# Patient Record
Sex: Male | Born: 1965 | Marital: Single | Smoking: Never smoker
Health system: Southern US, Community
[De-identification: ages and names within clinical notes are randomized; demographics above are authoritative.]

---

## 2012-01-31 ENCOUNTER — Emergency Department (HOSPITAL_COMMUNITY): Payer: PRIVATE HEALTH INSURANCE

## 2012-01-31 ENCOUNTER — Encounter (HOSPITAL_COMMUNITY): Payer: Self-pay | Admitting: *Deleted

## 2012-01-31 ENCOUNTER — Emergency Department (HOSPITAL_COMMUNITY)
Admission: EM | Admit: 2012-01-31 | Discharge: 2012-01-31 | Disposition: A | Discharge status: Home / Self Care | Payer: PRIVATE HEALTH INSURANCE | Attending: Emergency Medicine | Admitting: Emergency Medicine

## 2012-01-31 DIAGNOSIS — J189 Pneumonia, unspecified organism: Secondary | ICD-10-CM | POA: Insufficient documentation

## 2012-01-31 DIAGNOSIS — R079 Chest pain, unspecified: Secondary | ICD-10-CM | POA: Insufficient documentation

## 2012-01-31 DIAGNOSIS — J45901 Unspecified asthma with (acute) exacerbation: Secondary | ICD-10-CM | POA: Insufficient documentation

## 2012-01-31 DIAGNOSIS — K7689 Other specified diseases of liver: Secondary | ICD-10-CM | POA: Insufficient documentation

## 2012-01-31 DIAGNOSIS — R0601 Orthopnea: Secondary | ICD-10-CM | POA: Insufficient documentation

## 2012-01-31 DIAGNOSIS — K760 Fatty (change of) liver, not elsewhere classified: Secondary | ICD-10-CM

## 2012-01-31 DIAGNOSIS — R0602 Shortness of breath: Secondary | ICD-10-CM | POA: Insufficient documentation

## 2012-01-31 LAB — CBC
HCT: 42.5 % (ref 39.0–52.0)
Hemoglobin: 14.9 g/dL (ref 13.0–17.0)
MCH: 31.2 pg (ref 26.0–34.0)
MCV: 88.9 fL (ref 78.0–100.0)
Platelets: 178 10*3/uL (ref 150–400)
RBC: 4.78 MIL/uL (ref 4.22–5.81)
WBC: 4.4 10*3/uL (ref 4.0–10.5)

## 2012-01-31 LAB — D-DIMER, QUANTITATIVE: D-Dimer, Quant: 2.31 ug/mL-FEU — ABNORMAL HIGH (ref 0.00–0.48)

## 2012-01-31 LAB — POCT I-STAT TROPONIN I: Troponin i, poc: 0 ng/mL (ref 0.00–0.08)

## 2012-01-31 LAB — BASIC METABOLIC PANEL
BUN: 22 mg/dL (ref 6–23)
CO2: 29 mEq/L (ref 19–32)
Calcium: 9.3 mg/dL (ref 8.4–10.5)
Chloride: 99 mEq/L (ref 96–112)
Creatinine, Ser: 1.07 mg/dL (ref 0.50–1.35)
Glucose, Bld: 127 mg/dL — ABNORMAL HIGH (ref 70–99)

## 2012-01-31 LAB — PRO B NATRIURETIC PEPTIDE: Pro B Natriuretic peptide (BNP): 11.8 pg/mL (ref 0–125)

## 2012-01-31 MED ORDER — IBUPROFEN 200 MG PO TABS
600.0000 mg | ORAL_TABLET | Freq: Once | ORAL | Status: AC
  Administered 2012-01-31: 600 mg via ORAL
  Filled 2012-01-31: qty 3

## 2012-01-31 MED ORDER — AZITHROMYCIN 250 MG PO TABS: 250.0000 mg | ORAL_TABLET | Freq: Every day | ORAL | Status: AC

## 2012-01-31 MED ORDER — IPRATROPIUM BROMIDE 0.02 % IN SOLN
0.5000 mg | Freq: Once | RESPIRATORY_TRACT | Status: AC
  Administered 2012-01-31: 0.5 mg via RESPIRATORY_TRACT
  Filled 2012-01-31: qty 2.5

## 2012-01-31 MED ORDER — ALBUTEROL SULFATE (5 MG/ML) 0.5% IN NEBU
5.0000 mg | INHALATION_SOLUTION | Freq: Once | RESPIRATORY_TRACT | Status: AC
  Administered 2012-01-31: 5 mg via RESPIRATORY_TRACT
  Filled 2012-01-31: qty 0.5

## 2012-01-31 MED ORDER — PREDNISONE 20 MG PO TABS: 60.0000 mg | ORAL_TABLET | Freq: Every day | ORAL | Status: AC

## 2012-01-31 MED ORDER — ALBUTEROL SULFATE (5 MG/ML) 0.5% IN NEBU
5.0000 mg | INHALATION_SOLUTION | Freq: Once | RESPIRATORY_TRACT | Status: AC
  Administered 2012-01-31: 5 mg via RESPIRATORY_TRACT
  Filled 2012-01-31: qty 1

## 2012-01-31 MED ORDER — ALBUTEROL SULFATE HFA 108 (90 BASE) MCG/ACT IN AERS: 1.0000 | INHALATION_SPRAY | Freq: Four times a day (QID) | RESPIRATORY_TRACT | Status: AC | PRN

## 2012-01-31 MED ORDER — PREDNISONE 20 MG PO TABS
60.0000 mg | ORAL_TABLET | Freq: Once | ORAL | Status: AC
  Administered 2012-01-31: 60 mg via ORAL
  Filled 2012-01-31: qty 3

## 2012-01-31 MED ORDER — ASPIRIN 325 MG PO TABS
325.0000 mg | ORAL_TABLET | ORAL | Status: AC
  Administered 2012-01-31: 325 mg via ORAL
  Filled 2012-01-31: qty 1

## 2012-01-31 MED ORDER — IOHEXOL 300 MG/ML  SOLN
100.0000 mL | Freq: Once | INTRAMUSCULAR | Status: AC | PRN
  Administered 2012-01-31: 100 mL via INTRAVENOUS

## 2012-01-31 NOTE — ED Notes (Signed)
Patient walked to the end of the hall and back his oxygen level was 98% and his pulse was 80 but he got short winded.

## 2012-01-31 NOTE — ED Notes (Signed)
Patient walked to the end of the hall and back oxygen level was 96% pulse was 80.  Patient complain of being short winded.

## 2012-01-31 NOTE — ED Notes (Signed)
PT c/o L chest pain starting at noon yesterday. Pt denies injury. Pt states increased pain w/ inspiration and lying on side or back.

## 2012-01-31 NOTE — Discharge Instructions (Signed)
It is important for you to follow up with a Primary Care Physician.  See resource guide below.  Take prescriptions as prescribed.  You d-dimer was elevated today.  You will need to follow up with a Primary Care Physician to have this rechecked.  Return to the Emergency Department if symptoms change or worsen, your chest pain returns, you become increasingly short of breath, have difficulty breathing, or any other symptoms that are concerning to you.

## 2012-01-31 NOTE — ED Notes (Signed)
Patient transported to X-ray 

## 2012-01-31 NOTE — ED Provider Notes (Signed)
History     CSN: [id number]  Arrival date & time 01/31/12  4098   First MD Initiated Contact with Patient 01/31/12 [phone]      Chief Complaint  Patient presents with  . Chest Pain    (Consider location/radiation/quality/duration/timing/severity/associated sxs/prior treatment) HPI Comments: Patient with PMH significant for asthma comes in today with a chief complaint of shortness of breath and left sided chest pain.  He describes the pain as a sharp tightness and states that it radiates through the back.  He reports that the pain began yesterday afternoon and is unchanged from onset.  Pain and shortness of breath worse when lying flat on his back.  He denies any cough, fever/chills, wheezing, diaphoresis, or nausea.  He has not taken anything for his symptoms.  He reports that he does not have an inhaler at home.  He denies any prior cardiac history.  He reports that he does not have a Cardiologist and has never had a cardiac stress test.  No history of HTN or hyperlipidemia.    Patient does not have any recent travel in the past 4 weeks, no surgeries in the past 4 weeks, no prior history of DVT/PE, no swelling of LE.  Patient currently does not smoke and reports that he never has.  Patient is a 46 y.o. male presenting with shortness of breath. The history is provided by the patient.  Shortness of Breath  Associated symptoms include chest pain, orthopnea and shortness of breath. Pertinent negatives include no fever, no rhinorrhea, no cough and no wheezing. He has had no prior hospitalizations. He has had no prior ICU admissions. He has had no prior intubations. His past medical history is significant for asthma.    History reviewed. No pertinent past medical history.  History reviewed. No pertinent past surgical history.  History reviewed. No pertinent family history.  History  Substance Use Topics  . Smoking status: Never Smoker   . Smokeless tobacco: Not on file  . Alcohol Use: No        Review of Systems  Constitutional: Negative for fever, chills and diaphoresis.  HENT: Negative for rhinorrhea.   Respiratory: Positive for chest tightness and shortness of breath. Negative for cough and wheezing.   Cardiovascular: Positive for chest pain and orthopnea. Negative for leg swelling.  Gastrointestinal: Negative for nausea and vomiting.  Skin: Negative for rash.  Neurological: Negative for dizziness, syncope and light-headedness.    Allergies  Review of patient's allergies indicates no known allergies.  Home Medications  No current outpatient prescriptions on file.  BP 133/83  Pulse 73  Temp(Src) 97.8 F (36.6 C) (Oral)  Resp 18  SpO2 100%  Physical Exam  Nursing note and vitals reviewed. Constitutional: He appears well-developed and well-nourished. No distress.  HENT:  Head: Normocephalic and atraumatic.  Mouth/Throat: Oropharynx is clear and moist.  Neck: Normal range of motion. Neck supple.  Cardiovascular: Normal rate, regular rhythm, normal heart sounds and intact distal pulses.   No murmur heard. Pulmonary/Chest: Effort normal. No accessory muscle usage. Not tachypneic. No respiratory distress. He has decreased breath sounds. He has wheezes. He has no rhonchi. He has no rales.  Neurological: He is alert.  Skin: Skin is warm and dry. No rash noted. He is not diaphoretic.  Psychiatric: He has a normal mood and affect.    ED Course  Procedures (including critical care time)  Labs Reviewed  BASIC METABOLIC PANEL - Abnormal; Notable for the following:    Glucose, Bld  127 (*)    GFR calc non Af Amer 83 (*)    All other components within normal limits  CBC   Dg Chest 2 View  01/31/2012  *RADIOLOGY REPORT*  Clinical Data: Shortness of breath and pleurisy.  CHEST - 2 VIEW  Comparison: None.  Findings: Shallow inspiration.  There is infiltration or atelectasis in both lung bases, greatest on the left.  No blunting of costophrenic angles.  No  pneumothorax.  Mild cardiac enlargement with normal pulmonary vascularity.  IMPRESSION: Infiltration or atelectasis in both lung bases, greater on the left.  Original Report Authenticated By: Marlon Pel, M.D.     No diagnosis found.  Discussed with Dr. Radford Pax.  10:15 AM  Reassessed patient after breathing treatment.  Patient reports that his shortness of breath has not improved.  Mildly decreased breath sounds in all lung fields.  Will order another breathing treatment.  11:15 AM Reassessed patient after 2nd breathing treatment.  Patient reports that his shortness of breath has not improved.  Lungs CTAB.   Patient ambulated in ED and pulse ox remained above 97.  However, shortness of breath worsened with ambulation.    Discussed with Dr. Radford Pax once again.  Patient does not feel that his shortness of breath has improved after two breathing treatments and Prednisone.  Will order 3 hour troponin and d-dimer.    12:30 PM Patient has elevated d-dimer.  Will order CTA to rule out PE. 3:14 PM Reassessed patient.  He reports that he is feeling much better at this time and that his shortness of breath has significantly improved.  Denies chest pain.  Results of the CTA still pending. 3:32 PM Discussed with Dr. Manson Passey with Radiology.  She reports that the computer system is down and she has not been able to see the image.  IT is currently working on the issue.  Therefore, results of the CTA are still pending at this time.  Patient has been notified of the delay. 3:59 PM Discussed results of the CTA and the plan with Dr. Radford Pax.  He recommends discharging the patient home with Rx for Prednisone, Albuterol inhaler, and Azithromycin.  He recommends having the patient follow up with PCP regarding the elevated d-dimer.     Date: 01/31/2012  Rate: 69  Rhythm: normal sinus rhythm  QRS Axis: normal  Intervals: normal  ST/T Wave abnormalities: normal  Conduction Disutrbances:none  Narrative  Interpretation:   Old EKG Reviewed: none available     MDM  Patient with PMH significant for asthma comes in today with SOB and left sided chest tightness.  Patient given two breathing treatments and 60 mg Prednisone while in the ED and eventually reported significant improvement in symptoms.  Patient with negative initial and 3 hour troponin.  Normal EKG.  CXR and CTA demonstrates possible pneumonia.  Will treat patient with Azithromycin for CAP.  Patient also had elevated d-dimer.  CTA negative for large pulmonary embolism.  Patient instructed to follow up with PCP regarding elevated d-dimer.  Patient not hypoxic, not tachycardic, and does not have any risk factors for PE.  Return precautions discussed with patient.  Patient verbalizes understanding and is in agreement with plan.        Pascal Lux [place], PA-C 01/31/12 2204

## 2012-02-01 NOTE — ED Provider Notes (Signed)
Medical screening examination/treatment/procedure(s) were performed by non-physician practitioner and as supervising physician I was immediately available for consultation/collaboration.    Nelia Shi, MD 02/01/12 1134

## 2014-08-21 ENCOUNTER — Emergency Department (HOSPITAL_COMMUNITY)
Admission: EM | Admit: 2014-08-21 | Discharge: 2014-08-21 | Discharge status: Against Medical Advice | Payer: PRIVATE HEALTH INSURANCE

## 2019-08-08 ENCOUNTER — Other Ambulatory Visit: Payer: Self-pay | Admitting: Orthopedic Surgery

## 2019-08-08 DIAGNOSIS — M25561 Pain in right knee: Secondary | ICD-10-CM

## 2019-08-08 DIAGNOSIS — R609 Edema, unspecified: Secondary | ICD-10-CM

## 2019-08-16 ENCOUNTER — Other Ambulatory Visit: Payer: Self-pay

## 2019-08-16 ENCOUNTER — Ambulatory Visit
Admission: RE | Admit: 2019-08-16 | Discharge: 2019-08-16 | Disposition: A | Discharge status: Home / Self Care | Payer: PRIVATE HEALTH INSURANCE | Source: Ambulatory Visit | Attending: Orthopedic Surgery | Admitting: Orthopedic Surgery

## 2019-08-16 DIAGNOSIS — M25561 Pain in right knee: Secondary | ICD-10-CM

## 2019-08-16 DIAGNOSIS — R609 Edema, unspecified: Secondary | ICD-10-CM

## 2020-08-02 IMAGING — MR MR KNEE*R* W/O CM
4 of 7 series · 21 of 40 positions shown · non-contrast
Comparison: None.

CLINICAL DATA: Pain and swelling for 2 weeks no known injury

EXAM:
MRI OF THE RIGHT KNEE WITHOUT CONTRAST
TECHNIQUE: Multiplanar, multisequence MR imaging of the knee was performed. No
intravenous contrast was administered.

[Series 3: T2 fat-sat · axial · 4.0mm · 0.31mm/px · z∈[-62,+61]mm · 5 of 29 slices shown]
[im 1/29]
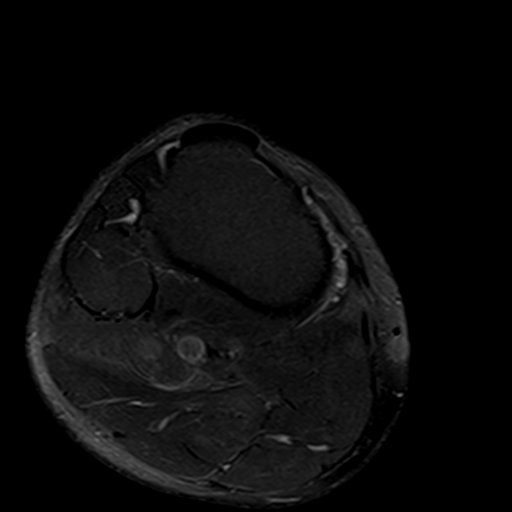
[im 6/29]
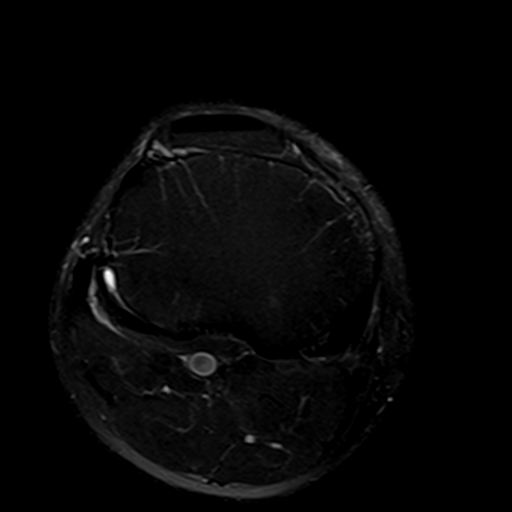
[im 12/29]
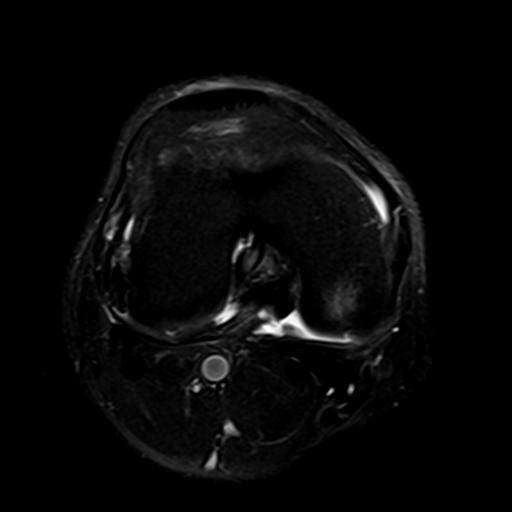
[im 17/29]
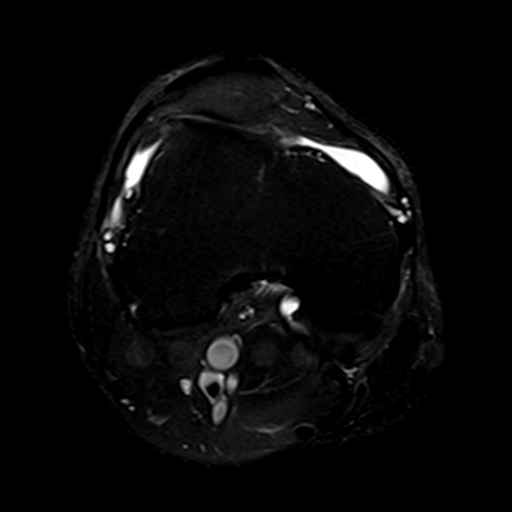
[im 29/29]
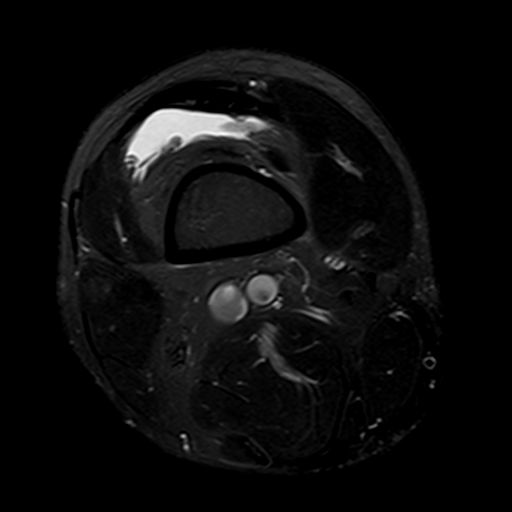

[Series 6: PD fat-sat · coronal · 4.0mm · 0.39mm/px · 6 of 24 slices shown (1 of 3)]
[im 1/24]
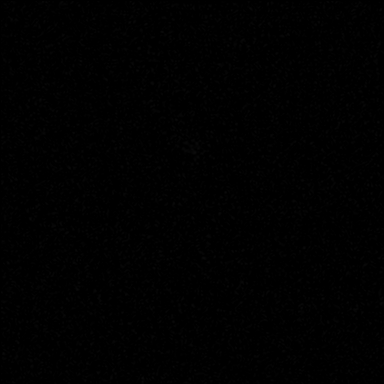
[im 5/24]
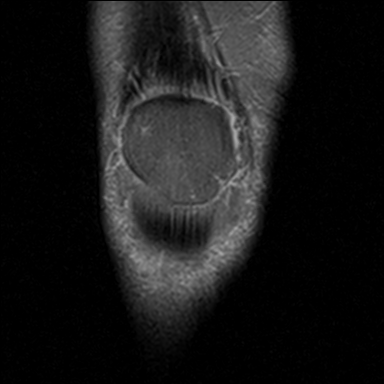
[im 10/24]
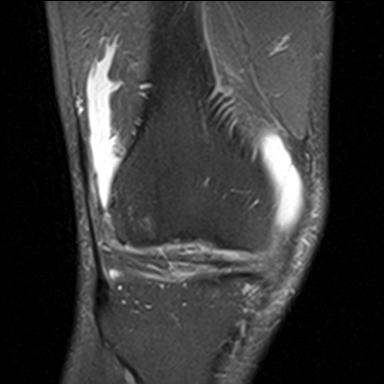
[im 14/24]
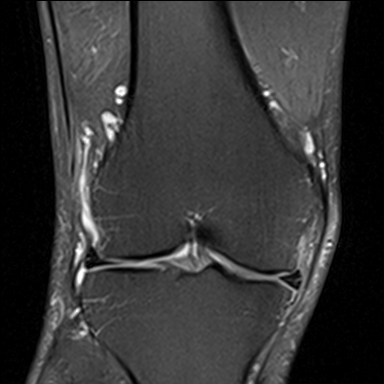
[im 19/24]
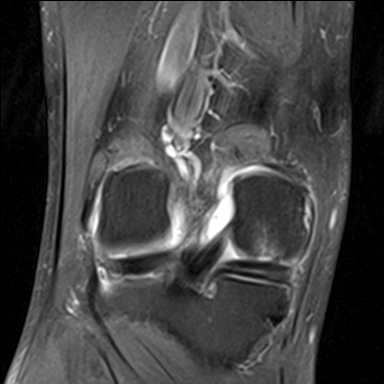
[im 24/24]
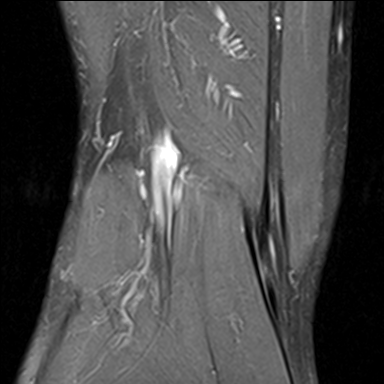

[Series 7: PD fat-sat · sagittal · 3.0mm · 0.29mm/px · 7 of 30 slices shown (2 of 3)]
[im 1/30]
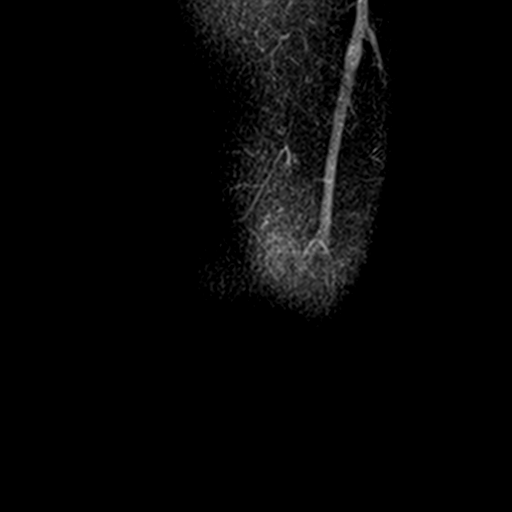
[im 5/30]
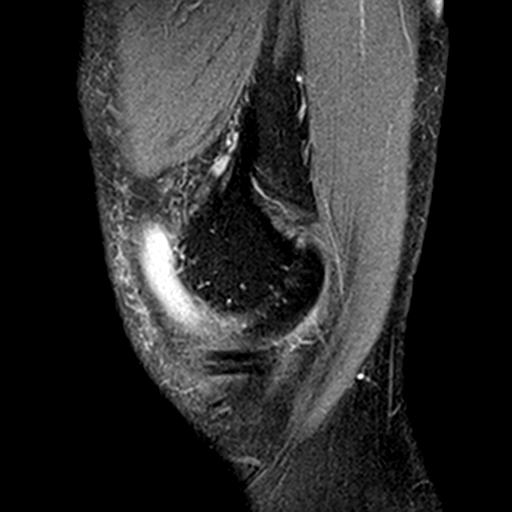
[im 10/30]
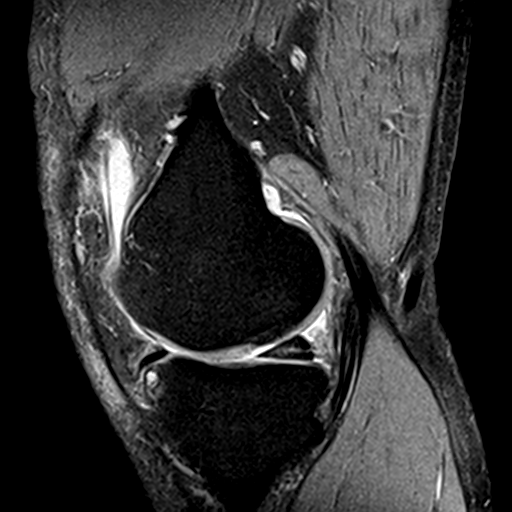
[im 15/30]
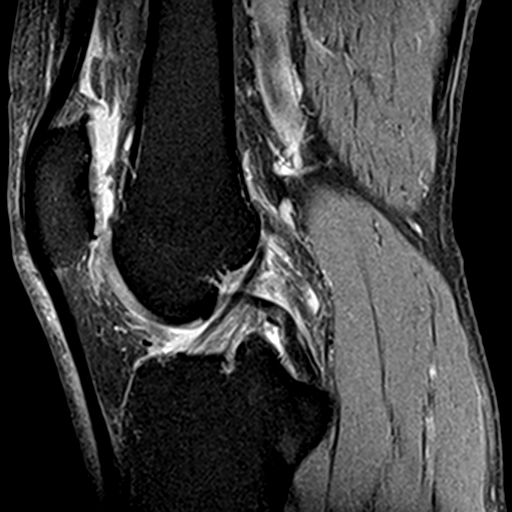
[im 20/30]
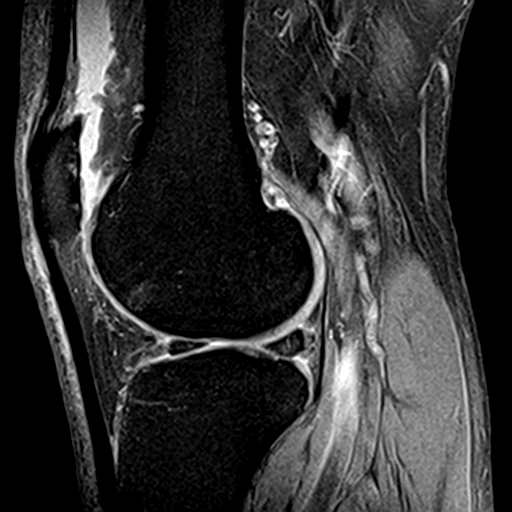
[im 25/30]
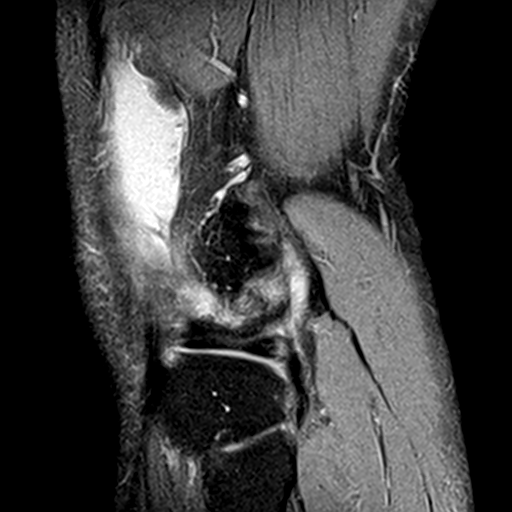
[im 30/30]
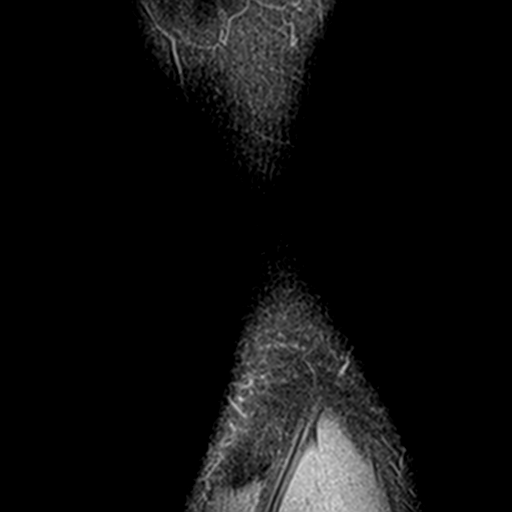

[Series 9: PD fat-sat · oblique · 2.0mm · 0.29mm/px · 3 of 11 slices shown (3 of 3)]
[im 1/11]
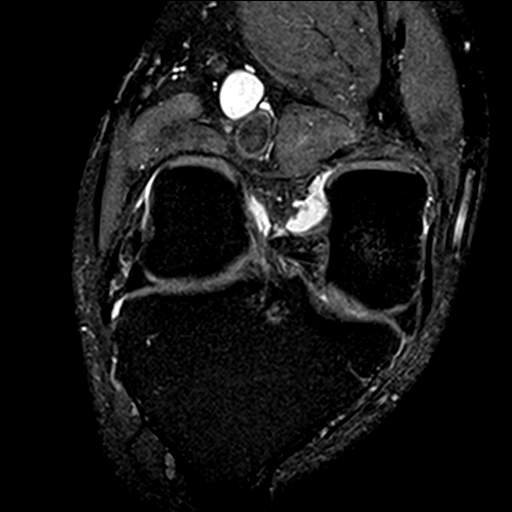
[im 6/11]
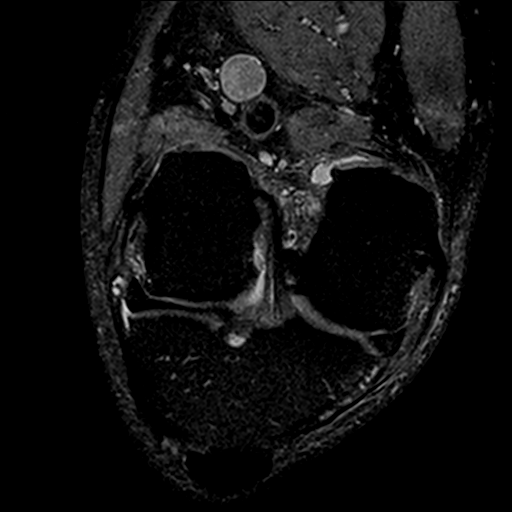
[im 11/11]
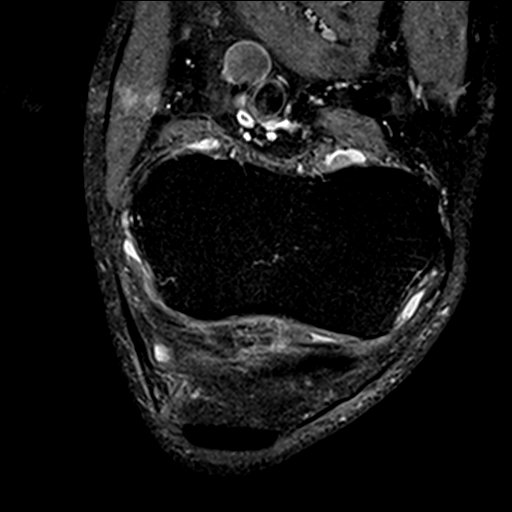

[21 of 40 positions shown; findings below may reference images not displayed]

FINDINGS: MENISCI

Medial: There is a nondisplaced horizontal longitudinal tear seen of
the posterior medial meniscus which extends to the mid body. The
root attachment however is still intact.

Lateral: There is increased intrameniscal signal seen in the
posterior horn of the lateral meniscus, however does not contact the
underlying articular surface. Posterior to the lateral meniscus
there is a T1/T2 dark lesion measuring 6 mm, likely a small loose
body.

LIGAMENTS

Cruciates: The ACL is intact. The PCL is intact.

Collaterals: Mild thickening of the medial collateral ligament,
however it is intact. The lateral collateral ligamentous complex is
intact.

CARTILAGE

Patellofemoral: There is slight lateral subluxation of the patella.
There is deep chondral thinning/loss seen at the lateral patellar
facet and lateral trochlear surface with underlying subchondral
cystic changes in the patellar surface. Small marginal osteophytes
are seen.

Medial compartment: There is a focal chondral defect seen in the
posterior weight-bearing surface of the medial femoral condyle
measuring approximately 5 mm in length with underlying reactive
marrow.

Lateral compartment: Mild chondral fissuring seen the weight-bearing
surface of the lateral femoral condyle lateral tibial plateau.

BONES: No fracture. No avascular necrosis. No pathologic marrow
infiltration.

JOINT: There is a moderate knee joint effusion with scattered
debris. There is edema seen within the superolateral corner of
Hoffa's fat pad. No plical thickening.

EXTENSOR MECHANISM: The patellar and quadriceps tendon are intact.
The retinaculum is unremarkable.

POPLITEAL FOSSA: No popliteal cyst.

OTHER: The visualized muscles are normal in appearance. Mild
prepatellar subcutaneous edema is seen.
IMPRESSION: 1. Nondisplaced tear of the posterior medial meniscus extending to
the mid body.
2. Intrasubstance degeneration of the posterior lateral meniscus.
3. 6 mm loose body seen adjacent to the posterior horn of the
lateral meniscus.
4. Intact cruciate ligaments
5. Tricompartmental osteoarthritis, most notable in the
patellofemoral compartment with grade 3-4 chondromalacia.
6. 6 mm focal chondral defect seen within the weight-bearing surface
of the medial femoral condyle with underlying reactive marrow.
7. Findings suggestive patellofemoral maltracking
8. Moderate knee joint effusion

## 2023-05-01 ENCOUNTER — Encounter (HOSPITAL_BASED_OUTPATIENT_CLINIC_OR_DEPARTMENT_OTHER): Payer: Self-pay

## 2023-05-01 ENCOUNTER — Emergency Department (HOSPITAL_BASED_OUTPATIENT_CLINIC_OR_DEPARTMENT_OTHER)
Admission: EM | Admit: 2023-05-01 | Discharge: 2023-05-01 | Disposition: A | Discharge status: Home / Self Care | Payer: BC Managed Care – PPO | Attending: Emergency Medicine | Admitting: Emergency Medicine

## 2023-05-01 ENCOUNTER — Other Ambulatory Visit: Payer: Self-pay

## 2023-05-01 ENCOUNTER — Emergency Department (HOSPITAL_BASED_OUTPATIENT_CLINIC_OR_DEPARTMENT_OTHER): Payer: BC Managed Care – PPO

## 2023-05-01 DIAGNOSIS — R109 Unspecified abdominal pain: Secondary | ICD-10-CM

## 2023-05-01 DIAGNOSIS — N201 Calculus of ureter: Secondary | ICD-10-CM | POA: Insufficient documentation

## 2023-05-01 LAB — COMPREHENSIVE METABOLIC PANEL
ALT: 68 U/L — ABNORMAL HIGH (ref 0–44)
AST: 47 U/L — ABNORMAL HIGH (ref 15–41)
Albumin: 4 g/dL (ref 3.5–5.0)
Alkaline Phosphatase: 52 U/L (ref 38–126)
Anion gap: 11 (ref 5–15)
BUN: 27 mg/dL — ABNORMAL HIGH (ref 6–20)
CO2: 18 mmol/L — ABNORMAL LOW (ref 22–32)
Calcium: 8.9 mg/dL (ref 8.9–10.3)
Chloride: 103 mmol/L (ref 98–111)
Creatinine, Ser: 1.92 mg/dL — ABNORMAL HIGH (ref 0.61–1.24)
GFR, Estimated: 41 mL/min — ABNORMAL LOW (ref >60)
Glucose, Bld: 153 mg/dL — ABNORMAL HIGH (ref 70–99)
Potassium: 3.7 mmol/L (ref 3.5–5.1)
Sodium: 132 mmol/L — ABNORMAL LOW (ref 135–145)
Total Bilirubin: 1.3 mg/dL — ABNORMAL HIGH (ref 0.3–1.2)
Total Protein: 8.4 g/dL — ABNORMAL HIGH (ref 6.5–8.1)

## 2023-05-01 LAB — URINALYSIS, ROUTINE W REFLEX MICROSCOPIC
Glucose, UA: NEGATIVE mg/dL
Ketones, ur: NEGATIVE mg/dL
Leukocytes,Ua: NEGATIVE
Nitrite: NEGATIVE
Protein, ur: 100 mg/dL — AB
Specific Gravity, Urine: >=1.03 (ref 1.005–1.030)
pH: 5.5 (ref 5.0–8.0)

## 2023-05-01 LAB — URINALYSIS, MICROSCOPIC (REFLEX)

## 2023-05-01 LAB — CBC WITH DIFFERENTIAL/PLATELET
Abs Immature Granulocytes: 0.04 10*3/uL (ref 0.00–0.07)
Basophils Absolute: 0 10*3/uL (ref 0.0–0.1)
Basophils Relative: 0 %
Eosinophils Absolute: 0 10*3/uL (ref 0.0–0.5)
Eosinophils Relative: 0 %
HCT: 42.1 % (ref 39.0–52.0)
Hemoglobin: 15.2 g/dL (ref 13.0–17.0)
Immature Granulocytes: 1 %
Lymphocytes Relative: 7 %
Lymphs Abs: 0.6 10*3/uL — ABNORMAL LOW (ref 0.7–4.0)
MCH: 34.2 pg — ABNORMAL HIGH (ref 26.0–34.0)
MCHC: 36.1 g/dL — ABNORMAL HIGH (ref 30.0–36.0)
MCV: 94.6 fL (ref 80.0–100.0)
Monocytes Absolute: 0.8 10*3/uL (ref 0.1–1.0)
Monocytes Relative: 10 %
Neutro Abs: 6.5 10*3/uL (ref 1.7–7.7)
Neutrophils Relative %: 82 %
Platelets: 162 10*3/uL (ref 150–400)
RBC: 4.45 MIL/uL (ref 4.22–5.81)
RDW: 12.9 % (ref 11.5–15.5)
WBC: 8 10*3/uL (ref 4.0–10.5)
nRBC: 0 % (ref 0.0–0.2)

## 2023-05-01 LAB — LACTIC ACID, PLASMA: Lactic Acid, Venous: 1.2 mmol/L (ref 0.5–1.9)

## 2023-05-01 MED ORDER — ONDANSETRON 4 MG PO TBDP: 4.0000 mg | ORAL_TABLET | Freq: Three times a day (TID) | ORAL | 0 refills | Status: AC | PRN

## 2023-05-01 MED ORDER — OXYCODONE HCL 5 MG PO TABS: 5.0000 mg | ORAL_TABLET | Freq: Four times a day (QID) | ORAL | 0 refills | Status: AC | PRN

## 2023-05-01 MED ORDER — TAMSULOSIN HCL 0.4 MG PO CAPS: 0.4000 mg | ORAL_CAPSULE | Freq: Every day | ORAL | 0 refills | Status: AC

## 2023-05-01 MED ORDER — OXYCODONE-ACETAMINOPHEN 5-325 MG PO TABS: 1.0000 | ORAL_TABLET | Freq: Three times a day (TID) | ORAL | Status: DC | PRN

## 2023-05-01 MED ORDER — SODIUM CHLORIDE 0.9 % IV BOLUS
1000.0000 mL | Freq: Once | INTRAVENOUS | Status: AC
  Administered 2023-05-01: 1000 mL via INTRAVENOUS

## 2023-05-01 MED ORDER — ONDANSETRON HCL 4 MG/2ML IJ SOLN: 4.0000 mg | Freq: Three times a day (TID) | INTRAMUSCULAR | Status: DC | PRN

## 2023-05-01 NOTE — Discharge Instructions (Signed)
As discussed, your symptoms are secondary to two 2 mm stones.  They are right at the junction of your bladder about to pass.  Will send in medication called Zofran to take as needed for nausea/vomiting, medicine for pain as well as medicine called Flomax to help aid with passage of stone.  Call number attached your discharge papers to set up an appointment on Monday with urology.  If you develop fever, intractable pain/nausea/vomiting, symptoms concerning for urinary tract infection, return to emergency department.

## 2023-05-01 NOTE — ED Triage Notes (Signed)
Patient seen at urgent care for flank pain. He has blood in his urine and fever of 102 for them. He was given 60mg  Toradol and 1000mg  tylenol from them.

## 2023-05-01 NOTE — ED Provider Notes (Signed)
[facility] Provider Note   CSN: [id number] Arrival date & time: 05/01/23  0900     History  Chief Complaint  Patient presents with   Flank Pain    Dwayne Jackson is a 57 y.o. male.   Flank Pain   57 year old male presents emergency department with complaints of right-sided flank pain.  Patient states that he developed right-sided flank pain upon awakening this morning.  Denies history of similar symptoms.  States that he took his temperature when he woke up and it was around 31 F.  Went to an urgent care and found to be reportedly febrile with a temp of 102.  Was given 60 mg of Toradol as well as 1000 mg of Tylenol and states that fever and pain significantly improved.  Reports associated nausea when experiencing pain earlier.  Denies any vomiting, chest pain, shortness of breath, abdominal pain, urinary frequency/urinary urgency.  Does state that he is noticed blood in his urine today.  No significant pertinent past medical history.  Home Medications Prior to Admission medications   Medication Sig Start Date End Date Taking? Authorizing Provider  ondansetron (ZOFRAN-ODT) 4 MG disintegrating tablet Take 1 tablet (4 mg total) by mouth every 8 (eight) hours as needed for nausea or vomiting. 05/01/23  Yes Sherian Maroon A, PA  oxyCODONE (ROXICODONE) 5 MG immediate release tablet Take 1 tablet (5 mg total) by mouth every 6 (six) hours as needed for severe pain or breakthrough pain. 05/01/23  Yes Sherian Maroon A, PA  tamsulosin (FLOMAX) 0.4 MG CAPS capsule Take 1 capsule (0.4 mg total) by mouth daily. 05/01/23  Yes Sherian Maroon A, PA  albuterol (PROVENTIL HFA;VENTOLIN HFA) 108 (90 BASE) MCG/ACT inhaler Inhale 1-2 puffs into the lungs every 6 (six) hours as needed for wheezing. 01/31/12 01/30/13  Santiago Glad, PA-C      Allergies    Patient has no known allergies.    Review of Systems   Review of Systems  Genitourinary:   Positive for flank pain.  All other systems reviewed and are negative.   Physical Exam Updated Vital Signs BP (!) 116/101   Pulse 90   Temp 99.2 F (37.3 C) (Oral)   Resp 18   Ht 6' (1.829 m)   Wt 95.3 kg   SpO2 95%   BMI 28.48 kg/m  Physical Exam Vitals and nursing note reviewed.  Constitutional:      General: He is not in acute distress.    Appearance: He is well-developed.  HENT:     Head: Normocephalic and atraumatic.  Eyes:     Conjunctiva/sclera: Conjunctivae normal.  Cardiovascular:     Rate and Rhythm: Normal rate and regular rhythm.     Heart sounds: No murmur heard. Pulmonary:     Effort: Pulmonary effort is normal. No respiratory distress.     Breath sounds: Normal breath sounds.  Abdominal:     Palpations: Abdomen is soft.     Tenderness: There is no abdominal tenderness. There is right CVA tenderness. There is no left CVA tenderness, guarding or rebound.  Musculoskeletal:        General: No swelling.     Cervical back: Neck supple.  Skin:    General: Skin is warm and dry.     Capillary Refill: Capillary refill takes less than 2 seconds.  Neurological:     Mental Status: He is alert.  Psychiatric:        Mood and Affect:  Mood normal.     ED Results / Procedures / Treatments   Labs (all labs ordered are listed, but only abnormal results are displayed) Labs Reviewed  URINALYSIS, ROUTINE W REFLEX MICROSCOPIC - Abnormal; Notable for the following components:      Result Value   Hgb urine dipstick TRACE (*)    Bilirubin Urine SMALL (*)    Protein, ur 100 (*)    All other components within normal limits  CBC WITH DIFFERENTIAL/PLATELET - Abnormal; Notable for the following components:   MCH 34.2 (*)    MCHC 36.1 (*)    Lymphs Abs 0.6 (*)    All other components within normal limits  COMPREHENSIVE METABOLIC PANEL - Abnormal; Notable for the following components:   Sodium 132 (*)    CO2 18 (*)    Glucose, Bld 153 (*)    BUN 27 (*)    Creatinine,  Ser 1.92 (*)    Total Protein 8.4 (*)    AST 47 (*)    ALT 68 (*)    Total Bilirubin 1.3 (*)    GFR, Estimated 41 (*)    All other components within normal limits  URINALYSIS, MICROSCOPIC (REFLEX) - Abnormal; Notable for the following components:   Bacteria, UA FEW (*)    All other components within normal limits  URINE CULTURE  LACTIC ACID, PLASMA    EKG None  Radiology CT Renal Stone Study  Result Date: 05/01/2023 CLINICAL DATA:  Abdominal and right flank pain for 12 hours. Hematuria. EXAM: CT ABDOMEN AND PELVIS WITHOUT CONTRAST TECHNIQUE: Multidetector CT imaging of the abdomen and pelvis was performed following the standard protocol without IV contrast. RADIATION DOSE REDUCTION: This exam was performed according to the departmental dose-optimization program which includes automated exposure control, adjustment of the mA and/or kV according to patient size and/or use of iterative reconstruction technique. COMPARISON:  None Available. FINDINGS: Lower chest: Dependent atelectasis noted lung bases. Hepatobiliary: The liver shows diffusely decreased attenuation suggesting fat deposition. No suspicious focal abnormality in the liver on this study without intravenous contrast. There is no evidence for gallstones, gallbladder wall thickening, or pericholecystic fluid. No intrahepatic or extrahepatic biliary dilation. Pancreas: No focal mass lesion. No dilatation of the main duct. No intraparenchymal cyst. No peripancreatic edema. Spleen: No splenomegaly. No suspicious focal mass lesion. Adrenals/Urinary Tract: No adrenal nodule or mass. 1 mm nonobstructing stone identified lower pole right kidney. There is mild to moderate right hydroureteronephrosis with two immediately adjacent 2 mm stones in the distal right ureter just above the UVJ (axial 83/2 and well demonstrated on coronal 43/5). Punctate nonobstructing stones are seen in the upper pole and interpolar regions of the left kidney. No left  ureteral stone. No evidence for bladder stone. Stomach/Bowel: Stomach is unremarkable. No gastric wall thickening. No evidence of outlet obstruction. Duodenum is normally positioned as is the ligament of Treitz. No small bowel wall thickening. No small bowel dilatation. The terminal ileum is normal. The appendix is normal. No gross colonic mass. No colonic wall thickening. Vascular/Lymphatic: No abdominal aortic aneurysm. No abdominal aortic atherosclerotic calcification. There is no gastrohepatic or hepatoduodenal ligament lymphadenopathy. No retroperitoneal or mesenteric lymphadenopathy. No pelvic sidewall lymphadenopathy. Reproductive: Unremarkable. Other: No intraperitoneal free fluid. Musculoskeletal: No worrisome lytic or sclerotic osseous abnormality. IMPRESSION: 1. Two immediately adjacent 2 mm stones in the distal right ureter just above the UVJ with mild to moderate right hydroureteronephrosis. 2. Additional punctate nonobstructing stones in both kidneys. 3. Hepatic steatosis. Electronically Signed  By: Kennith Center M.D.   On: 05/01/2023 10:30    Procedures Procedures    Medications Ordered in ED Medications  ondansetron (ZOFRAN) injection 4 mg (has no administration in time range)  oxyCODONE-acetaminophen (PERCOCET/ROXICET) 5-325 MG per tablet 1 tablet (has no administration in time range)  sodium chloride 0.9 % bolus 1,000 mL (0 mLs Intravenous Stopped 05/01/23 1040)    ED Course/ Medical Decision Making/ A&P Clinical Course as of 05/01/23 1402  Sat May 01, 2023  1124 Consulted urology Dr. Inocente Salles regarding the patient who recommended outpatient management with follow-up on Monday with strict return precautions if intractable pain, nausea/vomiting, return of fever. [CR]    Clinical Course User Index [CR] Peter Garter, PA                             Medical Decision Making Amount and/or Complexity of Data Reviewed Labs: ordered. Radiology: ordered.  Risk Prescription  drug management.   This patient presents to the ED for concern of flank pain, this involves an extensive number of treatment options, and is a complaint that carries with it a high risk of complications and morbidity.  The differential diagnosis includes nephritis, nephrolithiasis, cystitis, sepsis, aortic dissection, aortic aneurysm, SBO/LBO, diverticulitis, appendicitis   Co morbidities that complicate the patient evaluation  See HPI   Additional history obtained:  Additional history obtained from EMR External records from outside source obtained and reviewed including hospital records   Lab Tests:  I Ordered, and personally interpreted labs.  The pertinent results include: No leukocytosis.  No evidence of anemia.  Platelets within range.  Mild decrease in bicarb and sodium of 132 and 18 respectively.  Mild worsening of kidney function with creatinine 1.92 from baseline around 1.4, BUN of 27 and GFR 41.  Mild transaminitis of 47 and 60.  UA significant for few bacteria, 6-10 WBCs but without RBCs, leukocytes, nitrites.  Lactic acid within normal limits.  Urine culture pending.  Imaging Studies ordered:  I ordered imaging studies including CT renal stone study I independently visualized and interpreted imaging which showed 2 immediately adjacent 2 mm stone in distal right ureter just above the UVJ with mild to moderate right hydroureteronephrosis.  Additional punctate nonobstructing stones in both kidneys.  Hepatic steatosis. I agree with the radiologist interpretation  Cardiac Monitoring: / EKG:  The patient was maintained on a cardiac monitor.  I personally viewed and interpreted the cardiac monitored which showed an underlying rhythm of: Sinus rhythm   Consultations Obtained:  See ED course  Problem List / ED Course / Critical interventions / Medication management  Hydroureteronephrosis  I ordered medication including 1 L saline, Zofran, Percocet   Reevaluation of the  patient after these medicines showed that the patient improved I have reviewed the patients home medicines and have made adjustments as needed   Social Determinants of Health:  Denies tobacco, illicit drug use   Test / Admission - Considered:  Hydroureteronephrosis Vitals signs within normal range and stable throughout visit. Laboratory/imaging studies significant for: See above 57 year old male presents emergency department with complaints of right-sided flank pain awakening from sleep earlier this morning.  Patient symptoms most likely secondary to 2 2 mm right stones and ureter causing hydroureter ureter nephrosis.  Patient with slightly worsening renal function with creatinine elevated of 1.92 from 1.4 to her baseline.  No evidence of urinary tract infection.  Consulted urology regarding patient given that he presented  with fever at urgent care and evidence of mild AKI who recommended outpatient management of symptoms given no evidence of urinary tract infection AKI is very minimal.  Low suspicion for sepsis given no leukocytosis, vital signs within normal range and stable throughout ED visit and without signs of urinary tract infection.  Will culture urine and discharge patient with pain medication, nausea medication and Flomax.  Treatment plan discussed at length with patient and he acknowledged understanding was agreeable to said plan.  Patient overall well-appearing, afebrile no acute distress.  Will recommend strict return precautions if intractable pain/nausea, recurrence of fever, urinary symptoms concerning for infection. Worrisome signs and symptoms were discussed with the patient, and the patient acknowledged understanding to return to the ED if noticed. Patient was stable upon discharge.          Final Clinical Impression(s) / ED Diagnoses Final diagnoses:  Ureterolithiasis  Flank pain    Rx / DC Orders ED Discharge Orders          Ordered    tamsulosin (FLOMAX) 0.4  MG CAPS capsule  Daily        05/01/23 1139    oxyCODONE (ROXICODONE) 5 MG immediate release tablet  Every 6 hours PRN        05/01/23 1139    ondansetron (ZOFRAN-ODT) 4 MG disintegrating tablet  Every 8 hours PRN        05/01/23 1139              Peter Garter, [place] 05/01/23 1403    Virgina Norfolk, DO 05/01/23 1432

## 2023-05-03 LAB — URINE CULTURE: Culture: 40000 — AB

## 2023-05-04 ENCOUNTER — Telehealth (HOSPITAL_BASED_OUTPATIENT_CLINIC_OR_DEPARTMENT_OTHER): Payer: Self-pay | Admitting: *Deleted

## 2023-05-04 NOTE — Progress Notes (Signed)
ED Antimicrobial Stewardship Positive Culture Follow Up   Dwayne Jackson is an 57 y.o. male who presented to [facility] on 05/01/2023 with a chief complaint of  Chief Complaint  Patient presents with   Flank Pain    Recent Results (from the past 720 hour(s))  Urine Culture     Status: Abnormal   Collection Time: 05/01/23 10:49 AM   Specimen: Urine, Clean Catch  Result Value Ref Range Status   Specimen Description   Final    URINE, CLEAN CATCH Performed at [facility], [address] [place], [place] [address]    Special Requests   Final    NONE Performed at [facility], [address] [place], [place] [address]    Culture 40,000 COLONIES/mL STAPHYLOCOCCUS AUREUS (A)  Final   Report Status 05/03/2023 FINAL  Final   Organism ID, Bacteria STAPHYLOCOCCUS AUREUS (A)  Final      Susceptibility   Staphylococcus aureus - MIC*    CIPROFLOXACIN <=0.5 SENSITIVE Sensitive     GENTAMICIN <=0.5 SENSITIVE Sensitive     NITROFURANTOIN 32 SENSITIVE Sensitive     OXACILLIN <=0.25 SENSITIVE Sensitive     TETRACYCLINE <=1 SENSITIVE Sensitive     VANCOMYCIN <=0.5 SENSITIVE Sensitive     TRIMETH/SULFA <=10 SENSITIVE Sensitive     CLINDAMYCIN <=0.25 SENSITIVE Sensitive     RIFAMPIN <=0.5 SENSITIVE Sensitive     Inducible Clindamycin NEGATIVE Sensitive     LINEZOLID 2 SENSITIVE Sensitive     * 40,000 COLONIES/mL STAPHYLOCOCCUS AUREUS    []  Treated with N/A, organism resistant to prescribed antimicrobial [x]  Patient discharged originally without antimicrobial agent and treatment is now indicated  New antibiotic prescription: cephalexin 500mg  PO BID x  7 days  ED Provider: Marita Kansas PA-C   [name], [name] 05/04/2023, 8:11 AM Clinical Pharmacist Monday - Friday phone -  [phone] Saturday - Sunday phone - [phone]

## 2023-05-04 NOTE — Telephone Encounter (Signed)
Post ED Visit - Positive Culture Follow-up: Successful Patient Follow-Up  Culture assessed and recommendations reviewed by:  [x]   []  Celedonio Miyamoto, Pharm.D., BCPS AQ-ID []  Garvin Fila, Pharm.D., BCPS []  Georgina Pillion, Pharm.D., BCPS []  [place], [address].D., BCPS, AAHIVP []  Estella Husk, Pharm.D., BCPS, AAHIVP []  Lysle Pearl, PharmD, BCPS []  Phillips Climes, PharmD, BCPS []  Agapito Games, PharmD, BCPS []  Verlan Friends, PharmD  Positive urine culture  [x]  Patient discharged without antimicrobial prescription and treatment is now indicated []  Organism is resistant to prescribed ED discharge antimicrobial []  Patient with positive blood cultures  Changes discussed with ED provider: Marita Kansas, PA-C New antibiotic prescription Dwayne Jackson  Contacted patient, date 05/04/23, time 0925   Lysle Pearl 05/04/2023, 9:23 AM
